# Patient Record
Sex: Female | Born: 1960 | Race: White | Hispanic: No | Marital: Married | Smoking: Former smoker
Health system: Southern US, Community
[De-identification: ages and names within clinical notes are randomized; demographics above are authoritative.]

## PROBLEM LIST (undated history)

## (undated) DIAGNOSIS — R000 Tachycardia, unspecified: Secondary | ICD-10-CM

## (undated) HISTORY — DX: Tachycardia, unspecified: R00.0

---

## 2003-08-14 ENCOUNTER — Ambulatory Visit: Admit: 2003-08-14 | Disposition: A | Discharge status: Home / Self Care | Payer: Self-pay | Source: Ambulatory Visit

## 2014-12-16 ENCOUNTER — Ambulatory Visit (INDEPENDENT_AMBULATORY_CARE_PROVIDER_SITE_OTHER): Payer: Self-pay | Admitting: Cardiovascular Disease

## 2015-11-17 ENCOUNTER — Ambulatory Visit (INDEPENDENT_AMBULATORY_CARE_PROVIDER_SITE_OTHER): Payer: Self-pay

## 2015-11-20 ENCOUNTER — Ambulatory Visit (INDEPENDENT_AMBULATORY_CARE_PROVIDER_SITE_OTHER): Payer: Self-pay | Admitting: Cardiovascular Disease

## 2015-11-20 ENCOUNTER — Ambulatory Visit (INDEPENDENT_AMBULATORY_CARE_PROVIDER_SITE_OTHER): Payer: Self-pay

## 2015-12-23 ENCOUNTER — Ambulatory Visit (INDEPENDENT_AMBULATORY_CARE_PROVIDER_SITE_OTHER): Payer: Self-pay

## 2015-12-23 ENCOUNTER — Other Ambulatory Visit (INDEPENDENT_AMBULATORY_CARE_PROVIDER_SITE_OTHER): Payer: Self-pay

## 2016-03-28 ENCOUNTER — Ambulatory Visit (INDEPENDENT_AMBULATORY_CARE_PROVIDER_SITE_OTHER): Payer: Self-pay | Admitting: Cardiovascular Disease

## 2016-03-28 LAB — VAHRT HISTORIC LVEF: Ejection Fraction: 60 %

## 2016-04-01 ENCOUNTER — Telehealth (INDEPENDENT_AMBULATORY_CARE_PROVIDER_SITE_OTHER): Payer: Self-pay | Admitting: Surgery

## 2016-04-01 NOTE — Telephone Encounter (Signed)
A user error has taken place: encounter opened in error, closed for administrative reasons.

## 2016-04-15 ENCOUNTER — Encounter (INDEPENDENT_AMBULATORY_CARE_PROVIDER_SITE_OTHER): Payer: Self-pay

## 2016-04-26 ENCOUNTER — Ambulatory Visit (INDEPENDENT_AMBULATORY_CARE_PROVIDER_SITE_OTHER): Payer: No Typology Code available for payment source | Admitting: Surgery

## 2016-04-26 ENCOUNTER — Encounter (INDEPENDENT_AMBULATORY_CARE_PROVIDER_SITE_OTHER): Payer: Self-pay | Admitting: Surgery

## 2016-04-26 VITALS — BP 123/82 | HR 70 | Temp 97.9°F | Resp 16 | Ht 62.5 in | Wt 160.2 lb

## 2016-04-26 DIAGNOSIS — Z803 Family history of malignant neoplasm of breast: Secondary | ICD-10-CM | POA: Insufficient documentation

## 2016-04-26 DIAGNOSIS — Z1589 Genetic susceptibility to other disease: Secondary | ICD-10-CM | POA: Insufficient documentation

## 2016-04-26 DIAGNOSIS — Z148 Genetic carrier of other disease: Secondary | ICD-10-CM

## 2016-04-26 DIAGNOSIS — R928 Other abnormal and inconclusive findings on diagnostic imaging of breast: Secondary | ICD-10-CM | POA: Insufficient documentation

## 2016-04-26 DIAGNOSIS — Z1501 Genetic susceptibility to malignant neoplasm of breast: Secondary | ICD-10-CM | POA: Insufficient documentation

## 2016-04-26 DIAGNOSIS — Z1509 Genetic susceptibility to other malignant neoplasm: Secondary | ICD-10-CM

## 2016-04-26 HISTORY — DX: Genetic susceptibility to other malignant neoplasm: Z15.09

## 2016-04-26 HISTORY — DX: Genetic susceptibility to malignant neoplasm of breast: Z15.01

## 2016-04-26 HISTORY — DX: Genetic susceptibility to other disease: Z15.89

## 2016-04-26 NOTE — Patient Instructions (Signed)
Please get your MRI done in August 2017. Please call our office for results 4 days after the test.  Also, please alternate every 6 month visits with Dr. Mayford Knife and Dr. Daphine Deutscher going forward.

## 2016-04-26 NOTE — Progress Notes (Signed)
Subjective:   Alison Schwartz is a 55 y.o. 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 She underwent genetic testing as a result of a family history of breast cancer. She did testing through Dr. Sherlon Handing.  She has had no new breast issues since her last visit with me in 2011. She is here to establish routine breast cancer surveillance care.  She also sees Dr. Mayford Knife.    Past Medical History   Diagnosis Date   . Tachycardia      Past Surgical History   Procedure Laterality Date   . Knee surgery       Family History   Problem Relation Age of Onset   . Heart attack Mother    . Lung cancer Father    . Prostate cancer Father    . Cancer Maternal Grandmother      Social History     Social History   . Marital Status: Married     Spouse Name: N/A   . Number of Children: N/A   . Years of Education: N/A     Occupational History   . Not on file.     Social History Main Topics   . Smoking status: Former Games developer   . Smokeless tobacco: Not on file   . Alcohol Use: 1.2 oz/week     2 Glasses of wine per week   . Drug Use: No   . Sexual Activity: Not on file     Other Topics Concern   . Not on file     Social History Narrative   . No narrative on file     Sulfa antibiotics  Current Outpatient Prescriptions   Medication Sig Dispense Refill   . aspirin EC 81 MG EC tablet Take 81 mg by mouth daily.     . Melatonin 10 MG Cap Take by mouth.     . metoprolol XL (TOPROL XL) 50 MG 24 hr tablet Take 50 mg by mouth daily.     . simvastatin (ZOCOR) 10 MG tablet Take 10 mg by mouth nightly.       No current facility-administered medications for this visit.       Review of Systems   Constitutional: Negative.    HENT: Negative.    Eyes: Negative.    Respiratory: Negative.    Cardiovascular: Negative.    Gastrointestinal: Negative.    Endocrine: Negative.    Genitourinary: Negative.    Musculoskeletal: Negative.    Skin: Negative.    Neurological: Negative.    Hematological: Negative.     Psychiatric/Behavioral: Negative.      Objective:     Filed Vitals:    04/26/16 1025   BP: 123/82   Pulse: 70   Temp: 97.9 F (36.6 C)   Resp: 16     Physical Exam   Constitutional: She is oriented to person, place, and time. She appears well-developed and well-nourished.   HENT:   Head: Normocephalic and atraumatic.   Eyes: Conjunctivae are normal. No scleral icterus.   Neck: Normal range of motion. Neck supple. No JVD present. No tracheal deviation present. No thyromegaly present.   Cardiovascular: Normal rate, regular rhythm and intact distal pulses.  Exam reveals no gallop and no friction rub.    No murmur heard.  Pulmonary/Chest: Effort normal and breath sounds normal. No respiratory distress. She has no wheezes. She has no rales. She exhibits no tenderness. Right breast exhibits no inverted  nipple, no mass, no nipple discharge, no skin change and no tenderness. Left breast exhibits no inverted nipple, no mass, no nipple discharge, no skin change and no tenderness. Breasts are symmetrical.   Abdominal: Soft. Bowel sounds are normal. She exhibits no distension and no mass. There is no tenderness. There is no rebound and no guarding.   Musculoskeletal: Normal range of motion.   Lymphadenopathy:     She has no cervical adenopathy.   Neurological: She is alert and oriented to person, place, and time.   Skin: Skin is warm and dry. No rash noted. No erythema. No pallor.   Psychiatric: She has a normal mood and affect. Her behavior is normal. Judgment and thought content normal.     Dr. Max Fickle note was reviewed.  The patient shared with me her genetic testing results on her phone - a copy is not available for review or scanning.      Assessment:     1. Monoallelic mutation of ATM gene  MRI breast bilateral with and without contrast   2. Family history of breast cancer  MRI breast bilateral with and without contrast   3. Abnormal MRI, breast  MRI breast bilateral with and without contrast       Previous RAD  Imaging:  No results found.  Patient Active Problem List    Diagnosis Date Noted   . Monoallelic mutation of ATM gene 16/08/9603   . Family history of breast cancer 04/26/2016   . Abnormal MRI, breast 04/26/2016     The patient is at risk for breast cancer - 2 fold over the general population.  Interestingly, she is at risk for radiation related gene mutations, and therefore I would suggest that she minimize mammography to Q 3 years, and rely instead on MRI. She had her most recent MRI done in February, and a 6 month follow up was recommended.  This will be done in August 2017.  IF normal, I would recommend yearly MRI, Q 6 month clinical examinations.      She understands this and concurs with the follow up recommendations.  Plan:     Orders Placed This Encounter   Procedures   . MRI breast bilateral with and without contrast     Standing Status: Future      Number of Occurrences:       Standing Expiration Date: 04/26/2017     Order Specific Question:  Is the patient pregnant?     Answer:  No     Order Specific Question:  What is the patient's sedation requirement?     Answer:  No Sedation     Order Specific Question:  Does the patient have a pacemaker or defibrillator?     Answer:  No     Order Specific Question:  Reason for Exam:     Answer:  history of ATM mutation, abnormal MRI on the left breast     Total of 45 minutes spent in consultation and exam of this patient.    Return in about 6 months (around 10/26/2016).

## 2016-07-25 ENCOUNTER — Telehealth (INDEPENDENT_AMBULATORY_CARE_PROVIDER_SITE_OTHER): Payer: Self-pay

## 2016-07-25 NOTE — Telephone Encounter (Signed)
Pt had a breast mri this morning 07/25/16 at [facility] and they told her something showed on the left breast she is concerned and would like to talk to you please call pt at [phone]

## 2016-07-26 ENCOUNTER — Other Ambulatory Visit (INDEPENDENT_AMBULATORY_CARE_PROVIDER_SITE_OTHER): Payer: Self-pay | Admitting: Surgery

## 2016-07-26 DIAGNOSIS — R928 Other abnormal and inconclusive findings on diagnostic imaging of breast: Secondary | ICD-10-CM

## 2016-07-26 NOTE — Telephone Encounter (Signed)
Discussed with patient the results of the MRI.    The lesion on the left side is still 5 mm and still enhances.  As it is persistent, it would be prudent to biopsy it.  She is in agreement with this.

## 2016-08-02 ENCOUNTER — Telehealth (INDEPENDENT_AMBULATORY_CARE_PROVIDER_SITE_OTHER): Payer: Self-pay

## 2016-08-02 NOTE — Telephone Encounter (Signed)
Pt had a breast bx done last Thursday at [facility] and she would like the results please call pt at [phone]

## 2016-08-02 NOTE — Telephone Encounter (Signed)
Called pt.  Notified about results

## 2016-08-09 ENCOUNTER — Encounter (INDEPENDENT_AMBULATORY_CARE_PROVIDER_SITE_OTHER): Payer: Self-pay

## 2017-02-21 ENCOUNTER — Institutional Professional Consult (permissible substitution) (INDEPENDENT_AMBULATORY_CARE_PROVIDER_SITE_OTHER): Payer: No Typology Code available for payment source | Admitting: Surgery

## 2017-03-06 ENCOUNTER — Ambulatory Visit (INDEPENDENT_AMBULATORY_CARE_PROVIDER_SITE_OTHER): Payer: Self-pay | Admitting: Nurse Practitioner

## 2017-03-08 ENCOUNTER — Telehealth (INDEPENDENT_AMBULATORY_CARE_PROVIDER_SITE_OTHER): Payer: Self-pay

## 2017-03-08 NOTE — Telephone Encounter (Signed)
Pt called and said she had her MRI done today. She wants to know if she needs to follow up.     Thanks,    Nelva Nay

## 2017-03-08 NOTE — Telephone Encounter (Signed)
Yes. She is overdue for a follow up

## 2017-03-14 ENCOUNTER — Encounter (INDEPENDENT_AMBULATORY_CARE_PROVIDER_SITE_OTHER): Payer: Self-pay | Admitting: Surgery

## 2017-03-14 ENCOUNTER — Ambulatory Visit (INDEPENDENT_AMBULATORY_CARE_PROVIDER_SITE_OTHER): Payer: No Typology Code available for payment source | Admitting: Surgery

## 2017-03-14 VITALS — BP 113/75 | HR 73 | Temp 98.1°F | Resp 12 | Ht 62.5 in | Wt 165.0 lb

## 2017-03-14 DIAGNOSIS — Z1501 Genetic susceptibility to malignant neoplasm of breast: Secondary | ICD-10-CM

## 2017-03-14 DIAGNOSIS — Z1509 Genetic susceptibility to other malignant neoplasm: Secondary | ICD-10-CM

## 2017-03-14 DIAGNOSIS — Z1589 Genetic susceptibility to other disease: Secondary | ICD-10-CM

## 2017-03-14 NOTE — Progress Notes (Signed)
Subjective:   Alison Schwartz is a 56 y.o. female who is here for close surveillance due to ATM gene carrier state.  She has no breast related issues since her last exam.  She is due to get her right rotator cuff repaired by Dr. Dallas Breeding soon.  She had an MRI done at [facility] last month.        Past Medical History:   Diagnosis Date   . Tachycardia      Past Surgical History:   Procedure Laterality Date   . KNEE SURGERY       Family History   Problem Relation Age of Onset   . Heart attack Mother    . Lung cancer Father    . Prostate cancer Father    . Cancer Maternal Grandmother      Social History     Social History   . Marital status: Married     Spouse name: N/A   . Number of children: N/A   . Years of education: N/A     Occupational History   . Not on file.     Social History Main Topics   . Smoking status: Former Games developer   . Smokeless tobacco: Never Used   . Alcohol use 1.2 oz/week     2 Glasses of wine per week   . Drug use: No   . Sexual activity: Not on file     Other Topics Concern   . Not on file     Social History Narrative   . No narrative on file     Sulfa antibiotics  Current Outpatient Prescriptions   Medication Sig Dispense Refill   . metoprolol XL (TOPROL XL) 50 MG 24 hr tablet Take 50 mg by mouth daily.     . simvastatin (ZOCOR) 10 MG tablet Take 10 mg by mouth nightly.     Marland Kitchen aspirin EC 81 MG EC tablet Take 81 mg by mouth daily.     . Melatonin 10 MG Cap Take by mouth.       No current facility-administered medications for this visit.        Review of Systems   Constitutional: Negative.    HENT: Negative.    Eyes: Negative.    Respiratory: Negative.    Cardiovascular: Negative.    Gastrointestinal: Negative.    Endocrine: Negative.    Genitourinary: Negative.    Musculoskeletal: Negative.    Skin: Negative.    Neurological: Negative.    Hematological: Negative.    Psychiatric/Behavioral: Negative.      Objective:     Vitals:    03/14/17 1451   BP: 113/75   Pulse: 73   Resp: 12   Temp: 98.1 F (36.7  C)     Physical Exam   Constitutional: She is oriented to person, place, and time. She appears well-developed and well-nourished.   HENT:   Head: Normocephalic and atraumatic.   Eyes: Conjunctivae are normal. No scleral icterus.   Neck: Normal range of motion. Neck supple. No JVD present. No tracheal deviation present. No thyromegaly present.   Cardiovascular: Normal rate, regular rhythm and intact distal pulses.  Exam reveals no gallop and no friction rub.    No murmur heard.  Pulmonary/Chest: Effort normal and breath sounds normal. No respiratory distress. She has no wheezes. She has no rales. She exhibits tenderness (She has some discomfort upon palpation of the right lateral chest wall. This could be due to her shoulder issues as well.).  Right breast exhibits no inverted nipple, no mass, no nipple discharge, no skin change and no tenderness. Left breast exhibits no inverted nipple, no mass, no nipple discharge, no skin change and no tenderness. Breasts are symmetrical.   Abdominal: Soft. Bowel sounds are normal. She exhibits no distension and no mass. There is no tenderness. There is no rebound and no guarding.   Musculoskeletal: Normal range of motion.   Lymphadenopathy:     She has no cervical adenopathy.   Neurological: She is alert and oriented to person, place, and time.   Skin: Skin is warm and dry. No rash noted. No erythema. No pallor.   Psychiatric: She has a normal mood and affect. Her behavior is normal. Judgment and thought content normal.     Dr. Max Fickle note was reviewed.  The patient shared with me her genetic testing results on her phone - a copy is not available for review or scanning.      Assessment:     1. Monoallelic mutation of ATM gene  MRI Breast Bilateral W WO Contrast    Mammo Digital Diagnostic Bilateral W Cad       Previous RAD Imaging:  No results found.  Patient Active Problem List    Diagnosis Date Noted   . Monoallelic mutation of ATM gene 78/29/5621   . Family history of  breast cancer 04/26/2016   . Abnormal MRI, breast 04/26/2016     The patient is at risk for breast cancer - 2 fold over the general population. She has no evidence of cancer currently.  MRI is due in 1 year as is a mammogram at that time ( Q3 years).  Plan:     Orders Placed This Encounter   Procedures   . MRI Breast Bilateral W WO Contrast     Standing Status:   Future     Standing Expiration Date:   03/14/2018     Order Specific Question:   Is the patient pregnant?     Answer:   No     Order Specific Question:   What is the patient's sedation requirement?     Answer:   No Sedation     Order Specific Question:   Does the patient have a pacemaker or defibrillator?     Answer:   No     Order Specific Question:   Reason for Exam:     Answer:   history of ATM mutation   . Mammo Digital Diagnostic Bilateral W Cad     Standing Status:   Future     Standing Expiration Date:   09/14/2018     Scheduling Instructions:      Please remind patient to bring MD order, referral or prescription with them on the day of the exam. If the order was faxed from the doctor's office to central scheduling please make sure the order is scanned into Epic.     Order Specific Question:   Select additional PRN/as needed imaging orders     Answer:   Tomosynthesis/3D mammogram     Order Specific Question:   Reason for Exam:     Answer:   history of ATM mutation, high risk screening     Order Specific Question:   Is the patient pregnant?     Answer:   No     RTO 1  Year with mammograms and MRI.  Alternate exams with Dr. Melvenia Beam.      Josephine Cables, MD. Carilyn Goodpasture

## 2017-06-22 ENCOUNTER — Ambulatory Visit (INDEPENDENT_AMBULATORY_CARE_PROVIDER_SITE_OTHER): Payer: Self-pay

## 2017-07-06 ENCOUNTER — Ambulatory Visit (INDEPENDENT_AMBULATORY_CARE_PROVIDER_SITE_OTHER): Payer: Self-pay

## 2017-07-10 ENCOUNTER — Ambulatory Visit (INDEPENDENT_AMBULATORY_CARE_PROVIDER_SITE_OTHER): Payer: Self-pay

## 2017-07-20 ENCOUNTER — Ambulatory Visit (INDEPENDENT_AMBULATORY_CARE_PROVIDER_SITE_OTHER): Payer: Self-pay

## 2017-07-24 ENCOUNTER — Ambulatory Visit (INDEPENDENT_AMBULATORY_CARE_PROVIDER_SITE_OTHER): Payer: Self-pay

## 2017-08-21 ENCOUNTER — Ambulatory Visit (INDEPENDENT_AMBULATORY_CARE_PROVIDER_SITE_OTHER): Payer: Self-pay

## 2017-09-05 ENCOUNTER — Ambulatory Visit (INDEPENDENT_AMBULATORY_CARE_PROVIDER_SITE_OTHER): Payer: Self-pay

## 2018-03-22 ENCOUNTER — Telehealth (INDEPENDENT_AMBULATORY_CARE_PROVIDER_SITE_OTHER): Payer: Self-pay

## 2018-03-22 NOTE — Telephone Encounter (Signed)
MRi is due now.  We spoke about alternating visits every 6 months with Dr. Mayford Knife, so we should stick to that schedule if at all possible.

## 2018-03-22 NOTE — Telephone Encounter (Signed)
Pt is coming to see you after Mammo in August but she had a question if she needed an MRI as well so she can do both mammo and MRI before comign to see you to discuss results?

## 2018-04-05 ENCOUNTER — Telehealth (INDEPENDENT_AMBULATORY_CARE_PROVIDER_SITE_OTHER): Payer: Self-pay

## 2018-04-05 DIAGNOSIS — Z803 Family history of malignant neoplasm of breast: Secondary | ICD-10-CM

## 2018-04-05 DIAGNOSIS — Z1501 Genetic susceptibility to malignant neoplasm of breast: Secondary | ICD-10-CM

## 2018-04-05 NOTE — Telephone Encounter (Signed)
Patient would like an order for her MRI

## 2018-05-02 ENCOUNTER — Other Ambulatory Visit (INDEPENDENT_AMBULATORY_CARE_PROVIDER_SITE_OTHER): Payer: Self-pay

## 2018-05-02 DIAGNOSIS — Z803 Family history of malignant neoplasm of breast: Secondary | ICD-10-CM

## 2018-05-02 DIAGNOSIS — Z1589 Genetic susceptibility to other disease: Secondary | ICD-10-CM

## 2018-05-02 DIAGNOSIS — Z1501 Genetic susceptibility to malignant neoplasm of breast: Secondary | ICD-10-CM

## 2018-05-04 ENCOUNTER — Telehealth (INDEPENDENT_AMBULATORY_CARE_PROVIDER_SITE_OTHER): Payer: Self-pay

## 2018-05-04 NOTE — Telephone Encounter (Addendum)
Pt called asking for results of MRI. Please give her a call at [phone].      Thanks,    Wm     Patient notified of non diagnostic MRI.  She has been called back to do additional contrast imaging - this has already been scheduled.    Josephine Cables, MD. Carilyn Goodpasture

## 2018-05-08 ENCOUNTER — Telehealth (INDEPENDENT_AMBULATORY_CARE_PROVIDER_SITE_OTHER): Payer: Self-pay

## 2018-05-08 NOTE — Telephone Encounter (Signed)
Pt had the repeat MRI done and it failed they couldn't get a good picture  and she wanted to go some place else and wanted to know if she needed a new order?

## 2018-05-16 ENCOUNTER — Other Ambulatory Visit (INDEPENDENT_AMBULATORY_CARE_PROVIDER_SITE_OTHER): Payer: Self-pay

## 2018-05-16 ENCOUNTER — Encounter (INDEPENDENT_AMBULATORY_CARE_PROVIDER_SITE_OTHER): Payer: Self-pay | Admitting: Surgery

## 2018-05-28 ENCOUNTER — Telehealth (INDEPENDENT_AMBULATORY_CARE_PROVIDER_SITE_OTHER): Payer: Self-pay

## 2018-05-28 NOTE — Telephone Encounter (Signed)
Pt is calling for results on an MRI that she repeated on 05/10/18 please call pt at [phone]

## 2018-06-07 ENCOUNTER — Encounter (INDEPENDENT_AMBULATORY_CARE_PROVIDER_SITE_OTHER): Payer: Self-pay | Admitting: Surgery

## 2018-06-07 ENCOUNTER — Ambulatory Visit (INDEPENDENT_AMBULATORY_CARE_PROVIDER_SITE_OTHER): Payer: No Typology Code available for payment source | Admitting: Surgery

## 2018-06-07 ENCOUNTER — Other Ambulatory Visit (INDEPENDENT_AMBULATORY_CARE_PROVIDER_SITE_OTHER): Payer: No Typology Code available for payment source | Admitting: Surgery

## 2018-06-07 VITALS — BP 110/75 | HR 75 | Temp 98.0°F | Resp 12 | Ht 62.5 in | Wt 156.0 lb

## 2018-06-07 DIAGNOSIS — Z1589 Genetic susceptibility to other disease: Secondary | ICD-10-CM

## 2018-06-07 DIAGNOSIS — Z1231 Encounter for screening mammogram for malignant neoplasm of breast: Secondary | ICD-10-CM

## 2018-06-07 DIAGNOSIS — Z1501 Genetic susceptibility to malignant neoplasm of breast: Secondary | ICD-10-CM

## 2018-06-07 DIAGNOSIS — Z1509 Genetic susceptibility to other malignant neoplasm: Secondary | ICD-10-CM

## 2018-06-07 DIAGNOSIS — Z803 Family history of malignant neoplasm of breast: Secondary | ICD-10-CM

## 2018-06-07 DIAGNOSIS — Z1239 Encounter for other screening for malignant neoplasm of breast: Secondary | ICD-10-CM

## 2018-06-07 NOTE — Patient Instructions (Signed)
Please followup with our office in 1 year  with repeat mammogram, MRI and examination.   Please return to the office sooner if any new problems or concerns should arise.

## 2018-06-07 NOTE — Progress Notes (Signed)
Breast Follow Up:    The patient presents for follow up of high risk screening for breast cancer.  She was last seen one year ago.  She has no new breast complaints and no new health issues. She is doing well.  She is an ATM carrier.  She had he rmammogram done yesterday and her MRI done in [facility] in June 2019.  Both studies show no evidence of cancer.    She has retired to [employer].    She has no new health issues and her shoulder is better.        Past Medical History :    Past Medical History:   Diagnosis Date   . Monoallelic mutation of ATM gene 1/61/0960   . Tachycardia        Past Surgical History:  Past Surgical History:   Procedure Laterality Date   . KNEE SURGERY     . ROTATOR CUFF REPAIR  2018       Family History:  Family History   Problem Relation Age of Onset   . Heart attack Mother    . Lung cancer Father    . Prostate cancer Father    . Cancer Maternal Grandmother        Social History:  Social History     Social History   . Marital status: Married     Spouse name: N/A   . Number of children: N/A   . Years of education: N/A     Occupational History   . Not on file.     Social History Main Topics   . Smoking status: Former Games developer   . Smokeless tobacco: Never Used   . Alcohol use 1.2 oz/week     2 Glasses of wine per week   . Drug use: No   . Sexual activity: Not on file     Other Topics Concern   . Not on file     Social History Narrative   . No narrative on file        ALLERGIES:  Allergies   Allergen Reactions   . Sulfa Antibiotics        Medications:    Current Outpatient Prescriptions:   .  aspirin EC 81 MG EC tablet, Take 81 mg by mouth daily., Disp: , Rfl:   .  Melatonin 10 MG Cap, Take by mouth., Disp: , Rfl:   .  metoprolol XL (TOPROL XL) 50 MG 24 hr tablet, Take 50 mg by mouth daily., Disp: , Rfl:   .  simvastatin (ZOCOR) 10 MG tablet, Take 10 mg by mouth nightly., Disp: , Rfl:     Review of Systems:  Constitutional: Negative.   HENT: Negative.   Respiratory: Negative.   Cardiovascular:  Negative.   Gastrointestinal: Negative for nausea, vomiting, diarrhea, constipation and abdominal distention.   Genitourinary: Negative.   Musculoskeletal: Negative.   Skin: no history of jaundice   Allergic/Immunologic: Negative for immunocompromised state.   Neurological: Negative.   Hematological: Negative for adenopathy. Does not bruise/bleed easily.   Psychiatric/Behavioral: Negative.     Objective:  Vitals:    06/07/18 1118   BP: 110/75   BP Site: Left arm   Patient Position: Sitting   Cuff Size: Medium   Pulse: 75   Resp: 12   Temp: 98 F (36.7 C)   TempSrc: Oral   Weight: 70.8 kg (156 lb)   Height: 1.588 m (5' 2.5")     Body mass index is 28.08  kg/m.     Physical Exam:   Constitutional: Alert, oriented.  Head: Normocephalic and atraumatic.   Eyes: No scleral icterus.   Neck: Normal range of motion. There is no cervical adenopathy  Cardiovascular: Normal rate, normal S1,2, no murmurs, rubs, or gallops.  Pulmonary/Chest: Effort normal, equal BS bilaterally without rales, wheezes, or stridor.  Breast: The breasts are symmetric, without skin changes, nipple changes or dimpling.     Musculoskeletal: Normal range of motion.   Neurological: alert and oriented to person, place, and time.   Skin:warm and dry  Psychiatric: Normal mood and affect. Behavior is normal. Judgment and thought content normal.   Nursing note and vitals reviewed.    Radiology:  Mammograms from [facility] were reviewed and the reports authenticated.  Reports from the MRI of the breast from [facility] were also reviewed. There is no sign of malignancy.        Assessment:Higher risk of breast cancer.  No sign of malignancy at this time.       Plan:Follow up in 1 year with repeat mammography and examination. Annual mammography and MRI.      Harvie Bridge MD, FACS

## 2018-06-21 ENCOUNTER — Ambulatory Visit (INDEPENDENT_AMBULATORY_CARE_PROVIDER_SITE_OTHER): Payer: No Typology Code available for payment source | Admitting: Surgery

## 2018-12-10 ENCOUNTER — Telehealth (INDEPENDENT_AMBULATORY_CARE_PROVIDER_SITE_OTHER): Payer: Self-pay

## 2018-12-10 NOTE — Telephone Encounter (Signed)
Pt's Cardiologist is recommending for her to have a CT of the heart and she would like your opinion on the amount of radiation for that test its to see if she has any blockages please call pt at [phone]

## 2018-12-10 NOTE — Telephone Encounter (Signed)
Fine for her to have the test.    Please let the patient know.

## 2018-12-11 ENCOUNTER — Telehealth (INDEPENDENT_AMBULATORY_CARE_PROVIDER_SITE_OTHER): Payer: Self-pay

## 2018-12-11 NOTE — Telephone Encounter (Signed)
Called pt and spoke to her about your response to her message she is agreeable.

## 2020-08-13 ENCOUNTER — Encounter (INDEPENDENT_AMBULATORY_CARE_PROVIDER_SITE_OTHER): Payer: Self-pay | Admitting: Surgery

## 2021-02-15 NOTE — Progress Notes (Signed)
OFFICE  [address] [place], [place] [address]     Alison, Schwartz    Date of Visit:  03/06/2017  Date of Birth: Aug 15, 1961  Age: 60 yrs.   Medical Record Number: [id number]  __  CURRENT DIAGNOSES     1. Hyperlipidemia, mixed, E78.2   2. Palpitations, R00.2  3. Pre-op cardiovascular examination, Z01.810  4. Heart murmur unspecified, R01.1  5. Chest pain, unspecified, R07.9  6. Supraventricular tachycardia, I47.1  __   ALLERGIES    Sulfa (Sulfonamide Antibiotics), Intolerance-unknown  __   MEDICATIONS     1. simvastatin 10 mg tablet, 1 po qd  2. aspirin 81 mg tablet,delayed release, 1 po qd  3. Zyrtec 10 mg tablet, PRN  4. biotin 1 mg tablet, 1 po qd  5. metoprolol succinate  ER 50 mg tablet,extended release 24 hr, 1 po q am  6. Align 4 mg capsule, 1 po qd  7. diclofenac sodium 50 mg tablet,delayed release, 1po bid  8. tramadol 50 mg tablet, PRN  __  CHIEF  COMPLAINT/REASON FOR VISIT  Followup of Hyperlipidemia, mixed, Followup of Palpitations and Pre-Operative Cardiovascular Evaluation  __  HISTORY  OF PRESENT ILLNESS  Alison Schwartz is a 60 year old of Dr. Shawnee Knapp with a history of palpitations who is on metoprolol with control of her palpitations. Holter monitor has only ever showed periods of mild sinus  tachycardia, but no dysrhythmias. She had a normal echocardiogram and she had a normal stress echo in 2015. She walks three to five miles a day without chest pain, pressure, tightness, burning, squeezing. No shortness of breath. No syncope, presyncope.  She is here today for a preoperative assessment. She is going to have rotator cuff surgery.  __  PAST HISTORY      Past Medical Illnesses: Hyperlipidemia, Anemia, GERD, Osteoporosis;  Past Cardiac Illnesses : No previous history of cardiac disease.; Infectious Diseases: No previous history of significant infectious diseases.;  Surgical Procedures: No previous surgical procedures.; Trauma History: No previous history of significant  trauma.;  Cardiology Procedures-Invasive: No previous interventional or invasive cardiology procedures.;  Cardiology Procedures-Noninvasive: MPI Dual Isotope Exercise -Normal November 2006, Echocardiogram September 2006, Stress Echocardiogram -Normal August 2015, Echocardiogram February 2017;  Left Ventricular Ejection Fraction: LVEF of 60% documented via echocardiogram on 12/23/2015  ___   FAMILY HISTORY  Father -- Carcinoma (lung), Carcinoma (prostate), Malignant neoplasm of liver   Mother -- Hypertension, Chronic obstructive pulmonary disease, Coronary arteriosclerosis    __  CARDIAC RISK FACTORS     Tobacco Abuse : has never used tobacco; Family History of Heart Disease: positive; Hyperlipidemia : positive; Hypertension: negative;  Diabetes Mellitus : negative; Prior History of Heart Disease: negative; Obesity : negative; Sedentary Life Style:negative; UEA:[id number]   __  SOCIAL HISTORY    Alcohol Use : drinks occasionally; Smoking: Does not smoke; Never smoker ([id number]); Diet : Regular diet and Caffeine use-1-2 per day; Exercise: No regular exercise;   __  PHYSICAL EXAMINATION     Vital Signs:  Blood Pressure:  110/70 Sitting, Left arm, regular cuff  114/70 Sitting, Right arm, regular cuff     Weight: 164.00 lbs.  Height: 62.5"  BMI:  29   Pulse: 69/min.       Constitutional:  Cooperative, alert and oriented,well developed, well nourished, in no acute distress. Skin: Warm and dry to touch, no apparent skin lesions, or masses  noted. ENT: Ears, Nose and throat reveal no gross abnormalities. No pallor or cyanosis. Dentition good.  Neck: no JVD, no bruits Chest: clear to auscultation bilaterally  Cardiac: Regular rhythm, S1 normal, S2 normal, No S3 or S4, Apical impulse not displaced, no murmurs, gallops or rubs detected. Abdomen : Abdomen soft, bowel sounds normoactive, no masses, no hepatosplenomegaly, non-tender, no bruits Peripheral Pulses: pulses full and equal in all extremities  Extremities/Back: no edema present  Neurological: No gross motor or sensory deficits noted, affect appropriate,  oriented to time, person and place.   __    Medications added today by the physician:      ECG:   ECG is a normal sinus rhythm.     IMPRESSIONS:   1. Preoperative assessment. I discussed this case with Dr. Shawnee Knapp. The patient is   able to perform more than four METs of activity and may proceed with her   surgery without further cardiac testing.  2. Episodic palpitations  that are likely short episodes of sinus tachycardia.   There has been no evidence of atrioventricular nodal reentrant tachycardia   or topical atrial tachycardia.  3. Normal echocardiogram.  4. Normal stress echo in 2015.    PLAN:    The patient will follow up in one year with Dr. Shawnee Knapp. Please do not hesitate to contact [facility] if we can be of any assistance throughout the perioperative period.    Arville Care, ANP     Tid: [id number]    cc: Gwenyth Ober MD  Dagmar Hait MD    Enclosure: ECG    AP

## 2021-02-15 NOTE — Progress Notes (Signed)
[facility] OFFICE  [address] [facility] [address] [place] [address]     [place], [place]    Date of Visit:  12/16/2014  Date of Birth: [birth year]  Age: 60 yrs.   Medical Record Number: [id number]  __  CURRENT DIAGNOSES     1. Hyperlipidemia, mixed, E78.2   2. Supraventricular tachycardia, I47.1  3. Chest pain, unspecified, R07.9  __  ALLERGIES     Sulfa (Sulfonamide Antibiotics), Intolerance-unknown  __  MEDICATIONS     1. simvastatin 10  mg tablet, 1 po qd  2. oxaprozin 600 mg tablet, 1 po qd PRN  3. melatonin 10 mg tablet, 1 po qhs  4. metoprolol succinate ER 25 mg tablet,extended release 24 hr, 1/2 tab qd  5. aspirin 81 mg tablet,delayed release, 1 po qd  __   CHIEF COMPLAINT/REASON FOR VISIT  Followup of Hyperlipidemia, mixed and Followup of Supraventricular tachycardia  __  HISTORY OF PRESENT ILLNESS   A 60 year old woman with episodic tachydysrhythmias, a normal echo and normal exercise study, mild hyperlipidemia, and palpitations who now returns for followup. The patient's event monitor showed some very short episodes of supraventricular tachycardia  but no sustained episodes. Her stress echo study was entirely normal demonstrating normal left ventricular systolic function with no evidence of exercise-induced ischemia with a normal heart rate and blood pressure response and no significant dysrhythmias.  She returns today feeling reasonably well. She has had very few symptoms of palpitations and no sustained tachydysrhythmias. She continues on the low-dose beta blocker and has taken an additional dose one or two times over the past six months. Her general  health has been good.    __  PAST HISTORY     Past Medical Illnesses : Hyperlipidemia, Anemia, GERD, Osteoporosis;  Past Cardiac Illnesses: No previous history of cardiac  disease.; Infectious Diseases: No previous history of significant infectious diseases.; Surgical Procedures : No previous surgical procedures.; Trauma History: No  previous history of significant trauma.; Cardiology  Procedures-Invasive: No previous interventional or invasive cardiology procedures.; Cardiology Procedures-Noninvasive : MPI Dual Isotope Exercise -Normal November 2006, Echocardiogram September 2006, Stress Echocardiogram -Normal August 2015; Left Ventricular Ejection Fraction : LVEF of 69% documented via nuclear study on 09/30/2005  REVIEW OF SYSTEMS     General: Denies recent weight loss, weight gain, fever or chills or change in exercise tolerance.; Integumentary : Denies any change in hair or nails, rashes, or skin lesions.; Eyes: Denies diplopia, history of glaucoma or visual field defects.;  Ears, Nose, Throat, Mouth: Denies any hearing loss, epistaxis, hoarseness or difficulty speaking.;Respiratory : Denies dyspnea, cough, wheezing or hemoptysis.; Cardiovascular: Please review HPI; Abdominal  : Denies ulcer disease, hematochezia or melena.;Musculoskeletal:Denies any history of venous insufficiency, arthritic symptoms or back problems.;  Neurological : Denies any history of recurrent strokes, TIA, or seizure disorder.; Psychiatric: Denies  any history of depression, substance abuse or change in cognitive functions.; Endocrine: Denies any history of weight change, heat/cold intolerance,  polydipsia, or polyuria; Hematologic/Immunologic: Denies any food allergies, seasonal allergies, bleeding disorders.  __   PHYSICAL EXAMINATION    Vital Signs:  Blood Pressure:  122/80 Sitting, Left arm, regular  cuff  126/82 Sitting, Right arm, regular cuff    Weight: 154.00 lbs.  Height:  62.5"  BMI: 27   Pulse: 63/min.    Respirations: 18/min.       Constitutional: Cooperative, alert and oriented,well developed,  well nourished, in no acute distress. Skin: Warm and dry to touch, no  apparent skin lesions, or masses noted.  Head: Normocephalic, normal hair pattern, no masses or tenderness ENT: Ears, Nose and throat reveal  no gross abnormalities. No pallor or cyanosis.  Dentition good. Neck: No palpable masses or adenopathy, no thyromegaly, no JVD, carotid pulses are full  and equal bilaterally without bruits. Chest: Normal symmetry, no tenderness to palpation, normal respiratory excursion, no intercostal retraction, no  use of accessory muscles, normal diaphragmatic excursion, clear to auscultation and percussion. Cardiac: Regular rhythm, S1 normal, S2 normal, No S3  or S4, Apical impulse not displaced, no murmurs, gallops or rubs detected. Abdomen: Abdomen soft, bowel sounds normoactive, no masses, no hepatosplenomegaly,  non-tender, no bruits Peripheral Pulses: The femoral, popliteal, dorsalis pedis, and posterior tibial pulses are full and equal bilaterally with no bruits  auscultated. Extremities/Back: No deformities, clubbing, cyanosis, erythema or edema observed. There are no spinal abnormalities noted. Normal muscle  strength and tone. Neurological: No gross motor or sensory deficits noted, affect appropriate, oriented to time, person and place.   __     Medications added today by the physician:      ECG: Twelve-lead ECG demonstrates a sinus mechanism, no change.    IMPRESSIONS:  1. Episodes of tachydysrhythmias that likely represent  very short episodes of ectopic   atrial tachycardia.  2. Normal echocardiogram demonstrating normal left ventricular systolic function and   no valvular pathology.  3. Normal stress echocardiogram study with a history of atypical chest  discomfort.  4. Mild cholesterol elevation.     RECOMMENDATIONS: No change in medication is recommended. The patient will call if her symptoms worsen. Otherwise, I will plan to see her back in one year or p.r.n.    Jonetta Speak, M.D.,  F.A.C.C.    WSL/tutjm     cc: Dagmar Hait MD    rw      ____________________________   Christianne Dolin  Return Visit 15 MIN 1 year  12 Lead ECG Today        Buckner Malta, MD, [facility]

## 2021-02-15 NOTE — Progress Notes (Signed)
[facility] OFFICE  [address] [facility] [address] [place] [address]     [place], [place]    Date of Visit:  11/20/2015  Date of Birth: [birth year]  Age: 60 yrs.   Medical Record Number: [id number]  __  CURRENT DIAGNOSES     1. Hyperlipidemia, mixed, E78.2   2. Supraventricular tachycardia, I47.1  3. Palpitations, R00.2  4. Heart murmur unspecified, R01.1  5. Chest pain, unspecified, R07.9  __  ALLERGIES     Sulfa (Sulfonamide Antibiotics), Intolerance-unknown  __  MEDICATIONS     1. simvastatin 10  mg tablet, 1 po qd  2. melatonin 10 mg tablet, 1 po qhs  3. aspirin 81 mg tablet,delayed release, 1 po qd  4. metoprolol succinate ER 25 mg tablet,extended release 24 hr, 1 tab qd  5. Augmentin 500 mg-125 mg tablet, 1 po bid 7 day course   6. metoprolol tartrate 25 mg tablet, 1 po bid  __  CHIEF COMPLAINT/REASON FOR VISIT  Followup of Hyperlipidemia, mixed, Followup of Palpitations  and Followup of Supraventricular tachycardia  __  HISTORY OF PRESENT ILLNESS  [name] is 60 years old. She has a history of episodic tachy-dysrhythmias  in the past believed to be in ectopic tachycardia, a normal echocardiogram and normal exercise study and now returns for early followup due to recurrence of symptoms. Starting about four to six weeks ago she has noted some episodes of a fast, regular  heartbeat that would last anywhere from a couple of minutes to five minutes. It was normally not associated with any other symptoms but on New Year's Eve it was associated with lightheadedness for a short period of time. Her heart monitor has recorded  heartrates in the 160-170 range. She denies any chest, arm, neck, throat, or jaw discomfort. She denies orthopnea or paroxysmal nocturnal dyspnea. She did just wear a 48-hour Holter monitor and brought it back. The prescan demonstrated a peak heartrate  of 144 that appears to be a sinus tachycardia and occurred while she was exercising. Her last echocardiogram was in 2009.    _  PAST HISTORY      Past Medical Illnesses: Hyperlipidemia, Anemia, GERD, Osteoporosis;  Past Cardiac Illnesses : No previous history of cardiac disease.; Infectious Diseases: No previous history of significant infectious diseases.;  Surgical Procedures: No previous surgical procedures.; Trauma History: No previous history of significant  trauma.; Cardiology Procedures-Invasive: No previous interventional or invasive cardiology procedures.;  Cardiology Procedures-Noninvasive: MPI Dual Isotope Exercise -Normal November 2006, Echocardiogram September 2006, Stress Echocardiogram -Normal August 2015;  Left Ventricular Ejection Fraction: LVEF of 55% documented via echocardiogram, stress on 07/11/2014   SOCIAL HISTORY    Alcohol Use: drinks occasionally;  Smoking: Does not smoke; Never smoker ([id number]); Diet: Regular diet and Caffeine use-1-2 per day;  Exercise: No regular exercise;   __  PHYSICAL EXAMINATION    Vital Signs:   Blood Pressure:  150/90 Sitting, Left arm, regular cuff  140/80 Sitting, Right arm, regular cuff    Weight:  157.80 lbs.  Height: 62.5"  BMI: 28    Pulse: 77/min.       Constitutional: Cooperative, alert and oriented,well developed, well  nourished, in no acute distress. Skin: Warm and dry to touch, no apparent skin lesions, or masses noted.  ENT: Ears, Nose and throat reveal no gross abnormalities. No pallor or cyanosis. Dentition good. Neck : No palpable masses or adenopathy, no thyromegaly, no JVD, carotid pulses are full and equal bilaterally without  bruits. Chest: Normal symmetry, no  tenderness to palpation, normal respiratory excursion, no intercostal retraction, no use of accessory muscles, normal diaphragmatic excursion, clear to auscultation and percussion. Cardiac : Regular rhythm, S1 normal, S2 normal, No S3 or S4, Apical impulse not displaced, no murmurs, gallops or rubs detected. Abdomen: Abdomen soft, bowel  sounds normoactive, no masses, no hepatosplenomegaly, non-tender, no  bruits Peripheral Pulses: The femoral, popliteal, dorsalis pedis, and posterior  tibial pulses are full and equal bilaterally with no bruits auscultated. Extremities/Back: No deformities, clubbing, cyanosis, erythema or edema observed.  There are no spinal abnormalities noted. Normal muscle strength and tone. Neurological: No gross motor or sensory deficits noted, affect appropriate,  oriented to time, person and place.   __    Medications added today by the physician:  metoprolol tartrate 25 mg tablet, 1 po bid, 60     ELECTROCARDIOGRAM:  The 12-lead electrocardiogram demonstrates a sinus mechanism and is within normal limits at a heartrate of 77.    IMPRESSIONS:  1. Episodes of tachy-dysrhythmias that likely represent short episodes of either atrioventricular    nodal reentry tachycardia or an ectopic atrial tachycardia. We have not been successful in   capturing any of these.  2. Normal echocardiogram in the past.  3. Normal stress echocardiogram study in the past.   4. Mildly elevated cholesterol.      RECOMMENDATIONS:  The patient will try to increase her metoprolol to 25 mg b.i.d. to see if this can abolish the symptoms. If they continue, I will have her wear a patch monitor for one month and return for a followup echocardiogram. We have an appointment  scheduled after that. If we are still unable to capture any of these episodes and they continue in spite of beta blocker medication, I would then have her get together with one of our electrophysiologists.     Jonetta Speak, MD, [facility]     WSL/tuarg    cc: Dagmar Hait MD    rw  ____________________________  TODAYS ORDERS  2D, color flow, doppler At [facility]  Mobile Cardiac Telemetry 1 month   12 Lead ECG Today  Diet mgmt edu, guidance and counseling TODAY

## 2021-02-15 NOTE — Progress Notes (Signed)
[facility] [facility] OFFICE  [address] [place], [place] [address]     [place], [place]    Date of Visit:  03/28/2016  Date of Birth: May 05, 1961  Age: 60 yrs.   Medical Record Number: [id number]  __  CURRENT DIAGNOSES     1. Hyperlipidemia, mixed, E78.2   2. Supraventricular tachycardia, I47.1  3. Palpitations, R00.2  4. Heart murmur unspecified, R01.1  5. Chest pain, unspecified, R07.9  __  ALLERGIES     Sulfa (Sulfonamide Antibiotics), Intolerance-unknown  __  MEDICATIONS     1. simvastatin 10  mg tablet, 1 po qd  2. melatonin 10 mg tablet, 1 po qhs  3. aspirin 81 mg tablet,delayed release, 1 po qd  4. metoprolol tartrate 25 mg tablet, 1 po bid  5. Zyrtec 10 mg tablet, PRN  6. biotin 1 mg tablet, 1 po qd  7. metoprolol  succinate ER 50 mg tablet,extended release 24 hr, 1 po q am  __  CHIEF COMPLAINT/REASON FOR VISIT  Followup of Hyperlipidemia, mixed, Followup  of Palpitations and Followup of Supraventricular tachycardia  __  HISTORY OF PRESENT ILLNESS  This is a 60 year old woman undergoing evaluation  for palpitations and episodic tachydysrhythmias, returns for routine followup. Holter monitor only demonstrated periods of mild sinus tachycardia but no dysrhythmias were seen. Peak heart rate was 144. Her echocardiogram was essentially normal with trace  to mild tricuspid and mitral regurgitation. The left atrium was towards the upper limit of normal size and there was normal left ventricular systolic function. The patient had her metoprolol increased to 25 mg b.i.d. and returns today feeling better.  She has had no sustained dysrhythmias and has had just some very brief episodes of mild tachycardia but this was clearly associated with anxiety and stress. She feels well. Her blood pressure has been normal, and she is tolerating the medication. She  would like to try a once a day formulation.   __  PAST HISTORY     Past Medical Illnesses : Hyperlipidemia, Anemia, GERD, Osteoporosis;  Past Cardiac Illnesses: No  previous history of cardiac  disease.; Infectious Diseases: No previous history of significant infectious diseases.; Surgical Procedures : No previous surgical procedures.; Trauma History: No previous history of significant trauma.; Cardiology  Procedures-Invasive: No previous interventional or invasive cardiology procedures.; Cardiology Procedures-Noninvasive : MPI Dual Isotope Exercise -Normal November 2006, Echocardiogram September 2006, Stress Echocardiogram -Normal August 2015, Echocardiogram February 2017; Left Ventricular Ejection Fraction : LVEF of 60% documented via echocardiogram on 12/23/2015  SOCIAL HISTORY    Alcohol Use : drinks occasionally; Smoking: Does not smoke; Never smoker ([id number]); Diet : Regular diet and Caffeine use-1-2 per day; Exercise: No regular exercise;   __  PHYSICAL EXAMINATION     Vital Signs:  Blood Pressure:  148/80 Sitting, Left arm, regular cuff  140/80 Sitting, Right arm,  regular cuff    Weight: 155.60 lbs.  Height: 62.5"   BMI: 28   Pulse: 67/min.        Constitutional: Cooperative, alert and oriented,well developed, well nourished, in no acute distress. Skin:  Warm and dry to touch, no apparent skin lesions, or masses noted. ENT: Ears, Nose and throat reveal  no gross abnormalities. No pallor or cyanosis. Dentition good. Neck: No palpable masses or adenopathy,  no thyromegaly, no JVD, carotid pulses are full and equal bilaterally without bruits. Chest: Normal symmetry, no tenderness to palpation, normal respiratory  excursion, no intercostal retraction, no use of accessory muscles, normal diaphragmatic excursion, clear  to auscultation and percussion. Cardiac: Regular  rhythm, S1 normal, S2 normal, No S3 or S4, Apical impulse not displaced, no murmurs, gallops or rubs detected. Abdomen: Abdomen soft, bowel sounds normoactive,  no masses, no hepatosplenomegaly, non-tender, no bruits Peripheral Pulses: The femoral, popliteal, dorsalis  pedis, and posterior tibial pulses are  full and equal bilaterally with no bruits auscultated. Extremities/Back: No deformities, clubbing, cyanosis, erythema  or edema observed. There are no spinal abnormalities noted. Normal muscle strength and tone. Neurological:  No gross motor or sensory deficits noted, affect appropriate, oriented to time, person and place.   __    Medications added today by the physician:   metoprolol succinate ER 50 mg tablet,extended release 24 hr, 1 po q am, 90        IMPRESSIONS:  1. Episodic tachydysrhythmias that likely  represent short episodes of sinus tachycardia. We have   not found any evidence of an atrioventricular node reentrant tachycardia or ectopic atrial tachycardia on   multiple forms of testing.   2. Normal echocardiogram with only minimal mitral  and tricuspid regurgitation.   3. Normal stress echocardiographic study in the past.   4. Mildly elevated cholesterol.     RECOMMENDATIONS:   The patient is going to try metoprolol succinate 50 mg daily to see if this also works as well and makes dosing easier. It will be up to her whether she wants to take the b.i.d. tartrate or the once a day succinate. She will call if she is having any  recurrent symptoms. Otherwise, I will see her back in one year or p.r.n.     Jonetta Speak, MD, [facility]    WSL/tumam    cc: Dagmar Hait  MD    RW  ___________________________  Christianne Dolin  Return Visit 15 MIN 1 year  12 Lead ECG Today  Diet mgmt edu, guidance and  counseling TODAY

## 2021-11-10 ENCOUNTER — Encounter (INDEPENDENT_AMBULATORY_CARE_PROVIDER_SITE_OTHER): Payer: Self-pay | Admitting: Surgery

## 2023-05-24 ENCOUNTER — Ambulatory Visit (INDEPENDENT_AMBULATORY_CARE_PROVIDER_SITE_OTHER): Payer: No Typology Code available for payment source | Admitting: Physician Assistant

## 2023-05-24 ENCOUNTER — Encounter (INDEPENDENT_AMBULATORY_CARE_PROVIDER_SITE_OTHER): Payer: Self-pay

## 2023-05-24 VITALS — BP 117/81 | HR 91 | Temp 96.4°F | Resp 18 | Ht 63.0 in | Wt 150.0 lb

## 2023-05-24 DIAGNOSIS — J329 Chronic sinusitis, unspecified: Secondary | ICD-10-CM

## 2023-05-24 DIAGNOSIS — B9689 Other specified bacterial agents as the cause of diseases classified elsewhere: Secondary | ICD-10-CM

## 2023-05-24 DIAGNOSIS — R519 Headache, unspecified: Secondary | ICD-10-CM

## 2023-05-24 MED ORDER — AMOXICILLIN-POT CLAVULANATE 875-125 MG PO TABS
1.0000 | ORAL_TABLET | Freq: Two times a day (BID) | ORAL | 0 refills | Status: AC
Start: 2023-05-24 — End: 2023-06-03

## 2023-05-24 NOTE — Patient Instructions (Signed)
You are being treated for: sinusitis  You have been prescribed: oral antibiotic   Helpful over the counter medicine you can use:  Tylenol and ibuprofen for headache and fever  Hydration and bland diet  Viral infection, treat symptomatically with:  Deslyn 12 hour for cough  Sudafed as a decongestant, If high blood pressure coricidin HBP  Flonase nasal spray for nasal congestion  Neti pot for sinus rinse  Mucinex 12 hour for mucous thinning    For follow up you should see improvement in 4-5 days, if not return to [facility]

## 2023-05-24 NOTE — Progress Notes (Signed)
[facility]  URGENT  CARE  PROGRESS NOTE     Patient: Alison Schwartz   Date: 05/24/2023   MRN: [id number]       Alison Schwartz is a 62 y.o. female      HISTORY     History obtained from: Patient    Chief Complaint   Patient presents with    Sinus Problem     Pt states to have sinus problems started a few weeks back and has come back, headaches, migraines, brain on fire per pt,           Sinus Problem  Associated symptoms include headaches and sinus pressure.      Pt is a 62 yo female who is visiting from [place] who has a history of sinusitis and sinus related migraine HA who aprox one month ago had nasal congestion with sinus pain/pressure and HA.  Her PCP sent a RX for azithromycin.  She reports her symptoms were better after taking med but then aprox a week ago she has started having migraine HA with sinus pain/pressure and drainage again. Pt denies fever or cough.    Review of Systems   HENT:  Positive for sinus pressure and sinus pain.    Neurological:  Positive for headaches.   All other systems reviewed and are negative.      History:    Pertinent Past Medical, Surgical, Family and Social History were reviewed.        Current Outpatient Medications:     metoprolol XL (TOPROL XL) 50 MG 24 hr tablet, Take 1 tablet (50 mg) by mouth daily, Disp: , Rfl:     Mounjaro 12.5 MG/0.5ML injection, , Disp: , Rfl:     pantoprazole (PROTONIX) 20 MG tablet, TAKE 1 TABLET BY MOUTH TWICE DAILY 30 MINUTES BEFORE BREAKFAST AND DINNER, Disp: , Rfl:     rosuvastatin (CRESTOR) 40 MG tablet, Take 1 tablet (40 mg) by mouth nightly, Disp: , Rfl:     amoxicillin-clavulanate (AUGMENTIN) 875-125 MG per tablet, Take 1 tablet by mouth 2 (two) times daily for 10 days, Disp: 20 tablet, Rfl: 0    aspirin EC 81 MG EC tablet, Take 81 mg by mouth daily., Disp: , Rfl:     Melatonin 10 MG Cap, Take by mouth., Disp: , Rfl:     simvastatin (ZOCOR) 10 MG tablet, Take 10 mg by mouth nightly., Disp: , Rfl:     Allergies   Allergen Reactions    Sulfa  Antibiotics        Medications and Allergies reviewed.    PHYSICAL EXAM     Vitals:    05/24/23 1010   BP: 117/81   BP Site: Left arm   Patient Position: Sitting   Cuff Size: Large   Pulse: 91   Resp: 18   Temp: (!) 96.4 F (35.8 C)   TempSrc: Tympanic   SpO2: 98%   Weight: 68 kg (150 lb)   Height: 1.6 m (5\' 3" )       Physical Exam  Constitutional:       General: She is not in acute distress.     Appearance: She is well-developed.   HENT:      Head: Normocephalic and atraumatic.      Right Ear: Hearing, tympanic membrane, ear canal and external ear normal.      Left Ear: Hearing, tympanic membrane, ear canal and external ear normal.      Nose: Congestion present.  Right Sinus: Maxillary sinus tenderness and frontal sinus tenderness present.      Left Sinus: Maxillary sinus tenderness and frontal sinus tenderness present.      Mouth/Throat:      Pharynx: Oropharynx is clear. No oropharyngeal exudate.     Eyes: Conjunctivae are normal. Right conjunctiva is not injected. Left conjunctiva is not injected. No scleral icterus. Cardiovascular:      Rate and Rhythm: Normal rate and regular rhythm.      Heart sounds: Normal heart sounds. No murmur heard.  Pulmonary:      Effort: Pulmonary effort is normal. No respiratory distress.      Breath sounds: Normal breath sounds. No wheezing.   Musculoskeletal:      Cervical back: Normal range of motion and neck supple.   Lymphadenopathy:      Cervical: No cervical adenopathy.   Neurological:      Mental Status: She is alert and oriented to person, place, and time.   Skin:     General: Skin is warm and dry.      Findings: No rash.   Psychiatric:         Behavior: Behavior is cooperative.   Vitals and nursing note reviewed.         UCC COURSE     There were no labs reviewed with this patient during the visit.    There were no x-rays reviewed with this patient during the visit.    No current facility-administered medications for this visit.       PROCEDURES      Procedures    MEDICAL DECISION MAKING     History, physical, labs/studies most consistent with bacterial sinusitis as the diagnosis.        Chart Review:  Prior PCP, Specialist and/or ED notes reviewed today: No  Prior labs/images/studies reviewed today: No    Differential Diagnosis: Upper Respiratory Infection, Sinusitis, Pneumonia, COVID-19, Allergic Rhinitis       ASSESSMENT     Encounter Diagnoses   Name Primary?    Bacterial sinusitis Yes    Nonintractable episodic headache, unspecified headache type                 PLAN      PLAN: You are being treated for: sinusitis  You have been prescribed: oral antibiotic   Helpful over the counter medicine you can use:  Tylenol and ibuprofen for headache and fever  Hydration and bland diet  Viral infection, treat symptomatically with:  Deslyn 12 hour for cough  Sudafed as a decongestant, If high blood pressure coricidin HBP  Flonase nasal spray for nasal congestion  Neti pot for sinus rinse  Mucinex 12 hour for mucous thinning    For follow up you should see improvement in 4-5 days, if not return to UC             No orders of the defined types were placed in this encounter.    Requested Prescriptions     Signed Prescriptions Disp Refills    amoxicillin-clavulanate (AUGMENTIN) 875-125 MG per tablet 20 tablet 0     Sig: Take 1 tablet by mouth 2 (two) times daily for 10 days       Discussed results and diagnosis with patient/family.  Reviewed warning signs for worsening condition, as well as, indications for follow-up with primary care physician and return to urgent care clinic.   Patient/family expressed understanding of instructions.     An After Visit Summary was provided  to the patient.

## 2024-01-04 IMAGING — MR MRI CERVICAL SPINE WITHOUT CONTRAST
7 of 12 series · 11 of 48 positions shown · IV contrast (gadolinium)
Comparison: X-ray from December 05, 2023.

________________________________________________________________________________________________ 
MRI CERVICAL SPINE WITHOUT CONTRAST, 01/04/2024 [DATE]: 
CLINICAL INDICATION: Neck pain. Bilateral upper extremity burning inside biceps.
TECHNIQUE: Multiplanar, multiecho position MR images of the cervical spine were 
performed without intravenous gadolinium enhancement. Patient was scanned on a 
magnet.

[Series 201: survey · axial · 10.0mm · 1.24mm/px · z∈[-17,+202]mm · 2 of 10 slices shown]
[im 1/10]
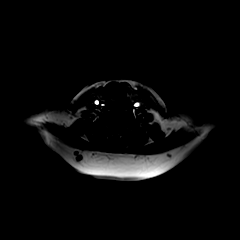
[im 10/10]
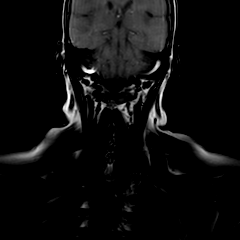

[Series 301: t2w_cor-surv · coronal · 5.0mm · 0.69mm/px · 1 of 7 slices shown]
[im 1/7]
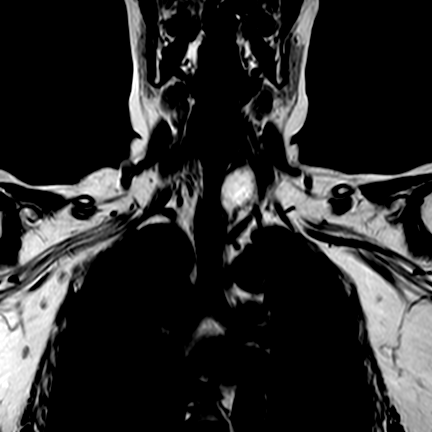

[Series 401: T1 · sagittal · 3.0mm · 0.39mm/px · 1 of 15 slices shown]
[im 1/15]
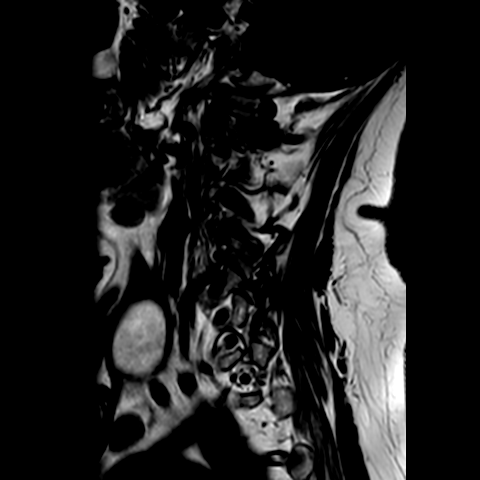

[Series 502: (id)_mdixon_tse · sagittal · 3.0mm · 0.34mm/px · 1 of 15 slices shown]
[im 1/15]
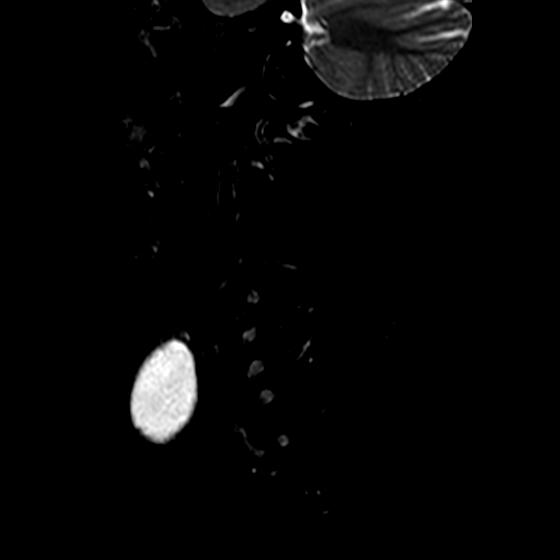

[Series 503: st2w_mdixon_tse · sagittal · 3.0mm · 0.34mm/px · 1 of 15 slices shown]
[im 1/15]
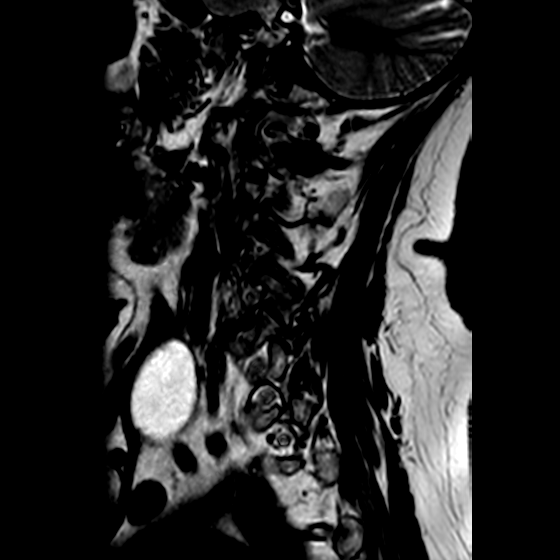

[Series 604: csp oblq left · oblique · 1.0mm · 0.15mm/px · 3 of 36 slices shown]
[im 1/36]
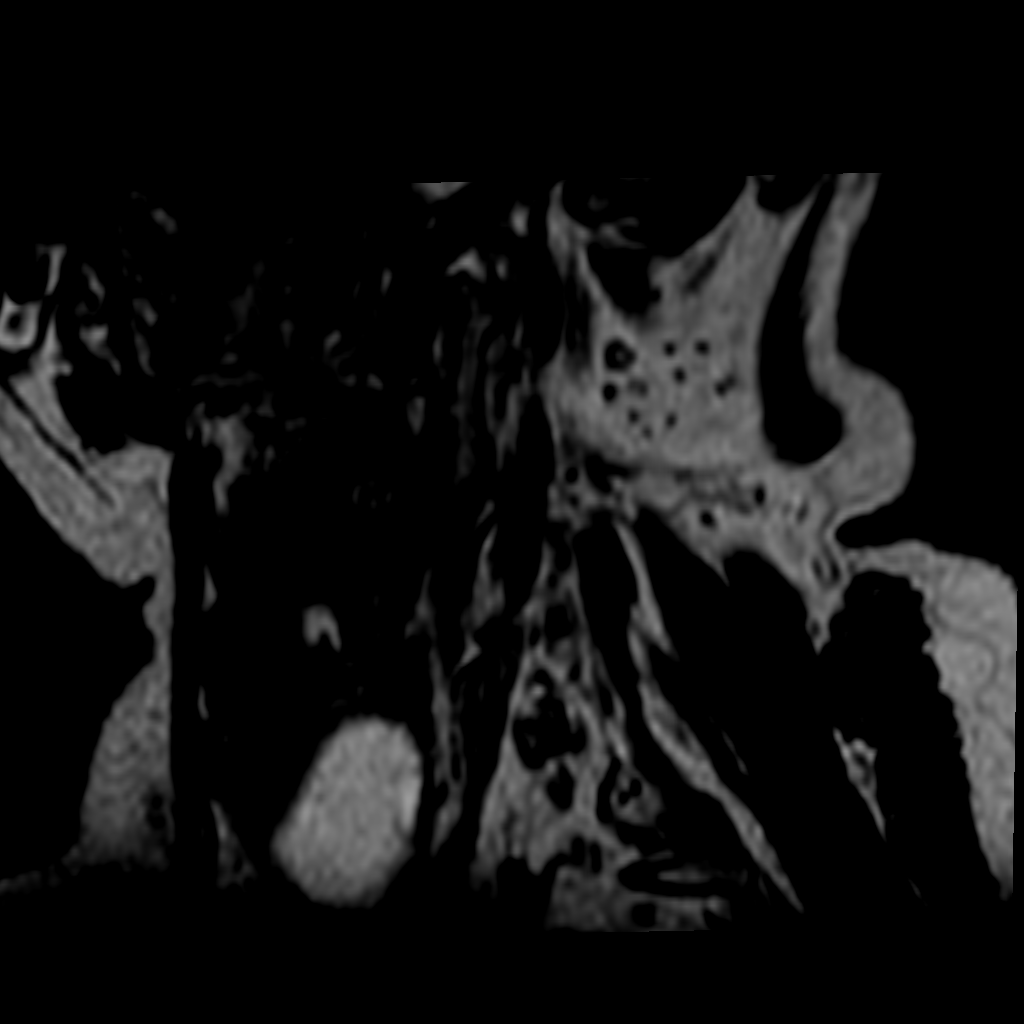
[im 18/36]
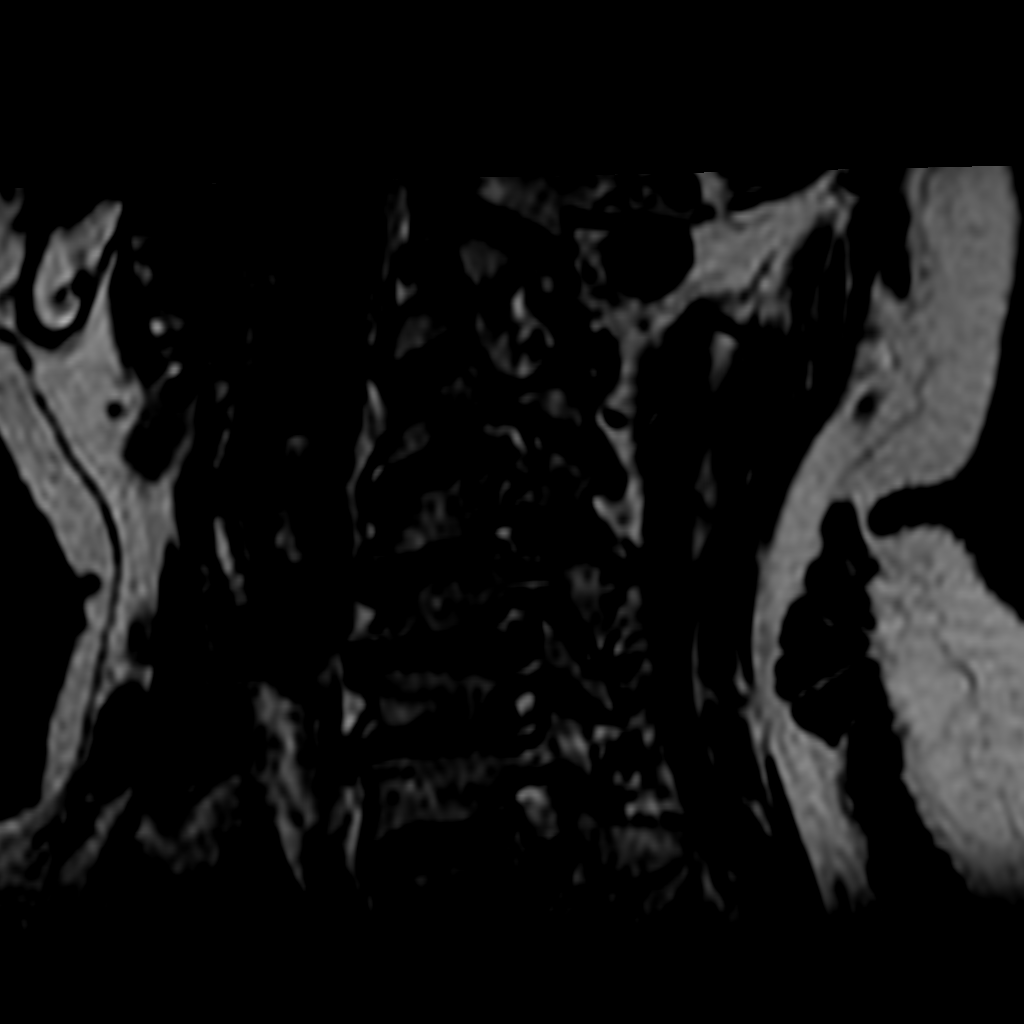
[im 36/36]
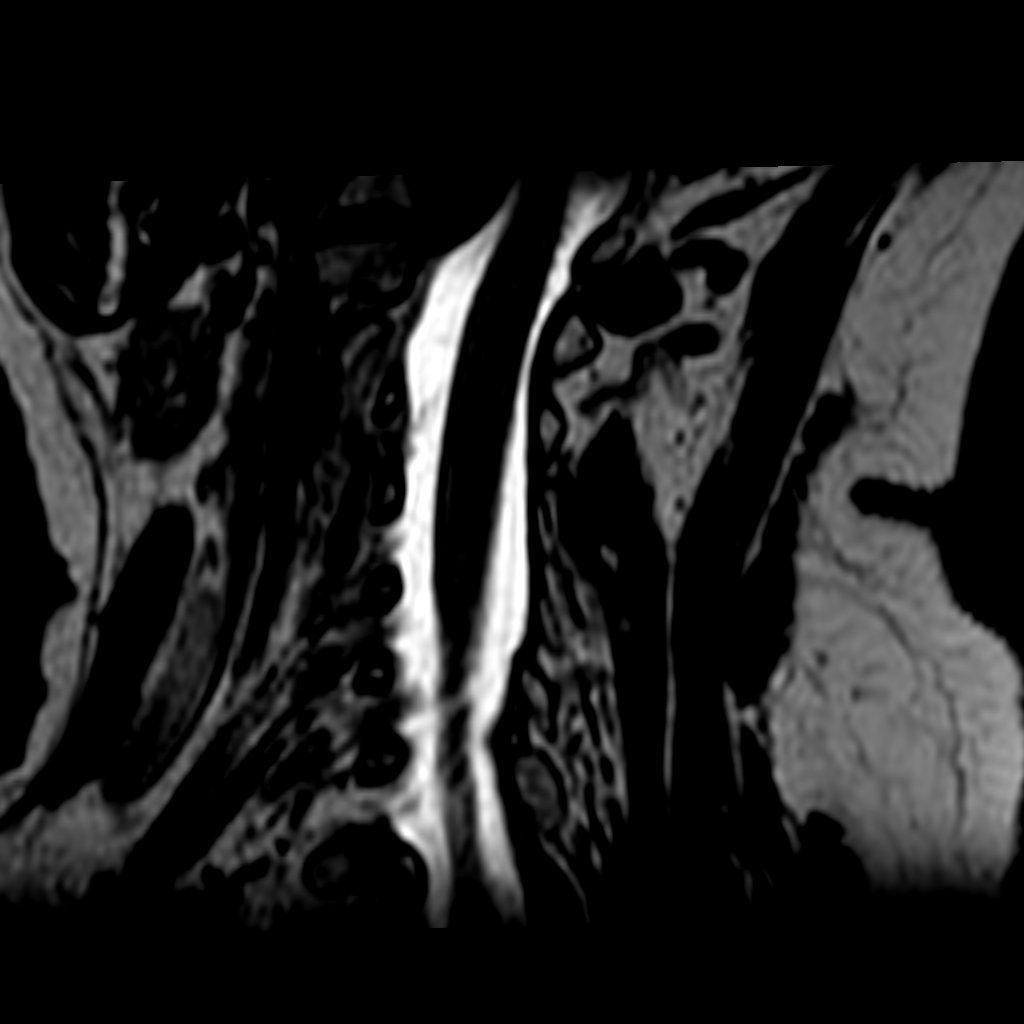

[Series 701: T2 · axial · 3.0mm · 0.29mm/px · z∈[-22,+45]mm · 2 of 18 slices shown]
[im 1/18]
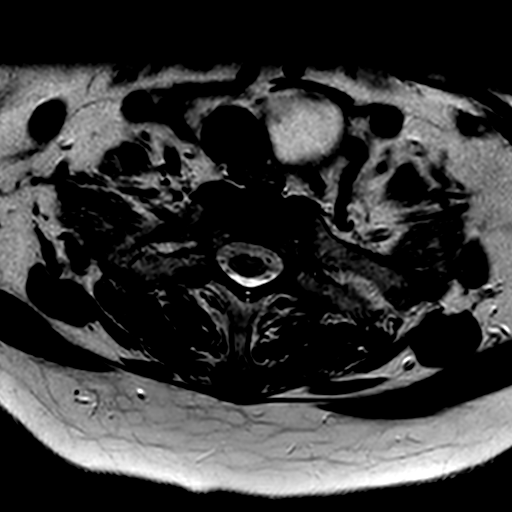
[im 18/18]
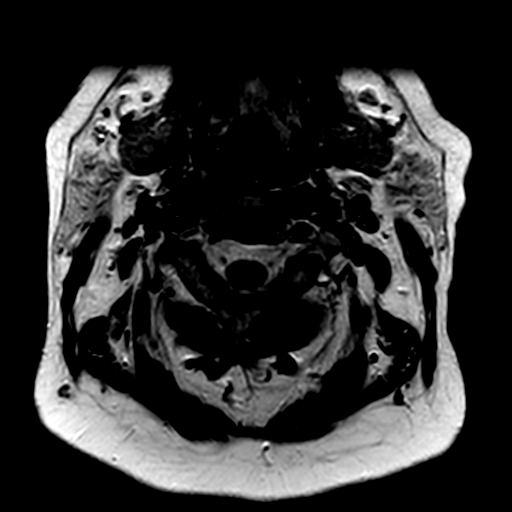

[11 of 48 positions shown; findings below may reference images not displayed]

FINDINGS: -------------------------------------------------------------------------------- 
----------------- 
GENERAL: 
ALIGNMENT: Mild anterolisthesis of C2 on C3, C3 on C4. Mild retrolisthesis of C6 
on C7. Minimal anterolisthesis of C7 on T1. 
VERTEBRAL BODY HEIGHT: Normal.  
MARROW SIGNAL: Active endplate change at C6-C7. 
CORD SIGNAL: Normal.  
ADDITIONAL FINDINGS: Large left thyroid nodule, likely colloid cyst, measuring 
3.1 cm. 
-------------------------------------------------------------------------------- 
---------------- 
SEGMENTAL: 
CRANIOCERVICAL JUNCTION: No significant stenosis. 
C2-C3: Trace disc bulge. No significant central canal or neural foraminal 
narrowing.  
C3-C4: Disc osteophyte complex. Right uncovertebral joint hypertrophy. Mild 
bilateral facet hypertrophy. Mild deformity of ventral cord with mild central 
canal narrowing. No significant neural foraminal narrowing. 
C4-C5: Disc osteophyte complex with bilateral uncovertebral joint hypertrophy. 
Left facet hypertrophy. Mild deformity of ventral cord with mild central canal 
narrowing. No significant right neural foraminal narrowing. Mild left neural 
foraminal narrowing. 
C5-C6: Disc osteophyte complex with bilateral uncovertebral joint hypertrophy. 
Mild central canal narrowing. Moderate right and severe left neural foraminal 
narrowing. 
C6-C7: Disc osteophyte complex with bilateral uncovertebral joint hypertrophy. 
Mild central canal narrowing. Moderate right neural foraminal narrowing. Mild 
left neural foraminal narrowing. 
C7-T1: Mild disc osteophyte complex. Mild left uncovertebral joint hypertrophy. 
Mild right facet hypertrophy. No significant central canal narrowing. No 
significant neural foraminal narrowing. 
-------------------------------------------------------------------------------- 
---------------
IMPRESSION: 1.  Discogenic/degenerative changes as above. 
2.  Cord signal abnormality: None. 
3.  Cord deformity: C3-C4, C4-C5 
4.  Severe neural foraminal narrowing: C5-C6 (left). 
5.  Likely large left thyroid colloid cyst, advise ultrasound correlation.

## 2024-01-04 IMAGING — MR MRI LUMBAR SPINE WITHOUT CONTRAST
7 of 9 series · 15 of 48 positions shown · IV contrast (gadolinium)
Comparison: MRI lumbar spine from June 01, 2022.

________________________________________________________________________________________________ 
MRI LUMBAR SPINE WITHOUT CONTRAST, 01/04/2024 [DATE]: 
CLINICAL INDICATION: Low back pain.
TECHNIQUE: Multiplanar, multiecho position MR images of the lumbar spine were 
performed without intravenous gadolinium enhancement. Patient was scanned on a 
1.5T magnet

[Series 1001: survey · axial · 10.0mm · 1.25mm/px · z∈[-341,-113]mm · 2 of 10 slices shown]
[im 1/10]
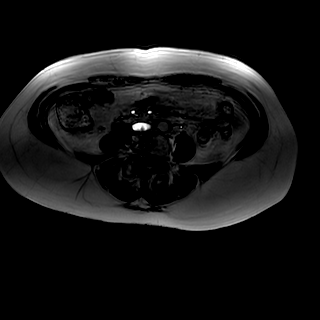
[im 10/10]
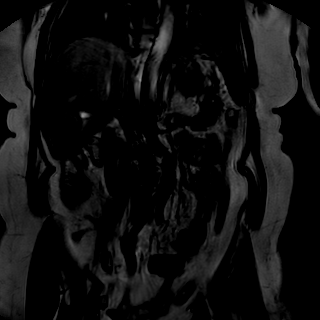

[Series 1101: t2w_cor-surv · coronal · 6.0mm · 0.59mm/px · 1 of 10 slices shown]
[im 1/10]
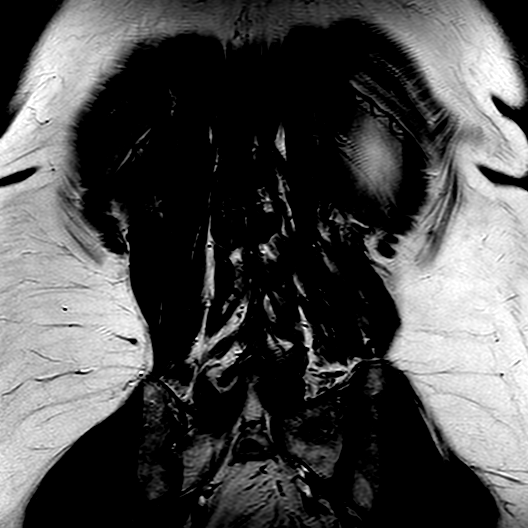

[Series 1201: t1_tse_sag · sagittal · 4.0mm · 0.34mm/px · 2 of 19 slices shown]
[im 1/19]
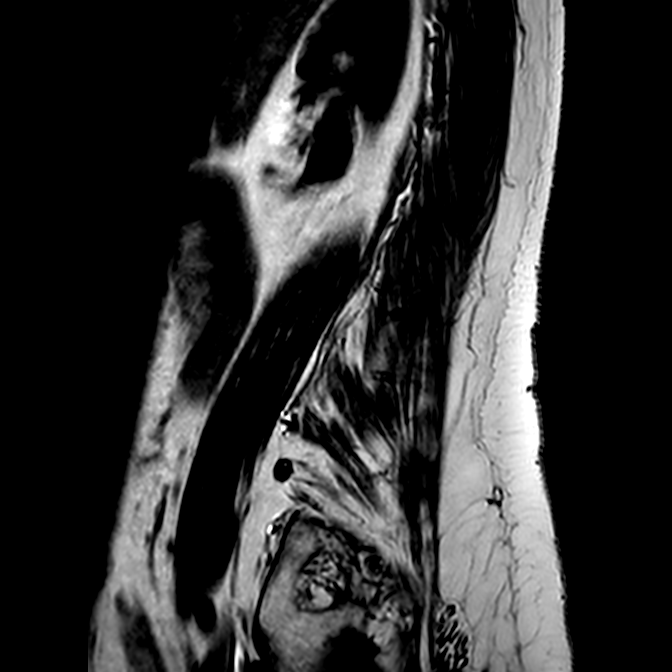
[im 19/19]
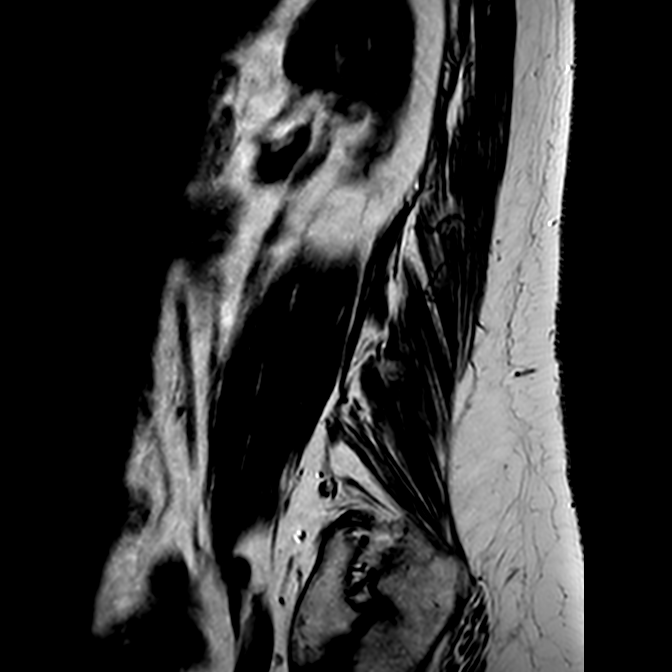

[Series 1302: (id)_mdixon_tse · sagittal · 4.0mm · 0.53mm/px · 2 of 19 slices shown]
[im 1/19]
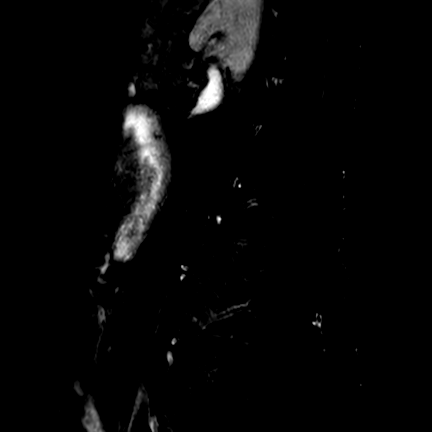
[im 19/19]
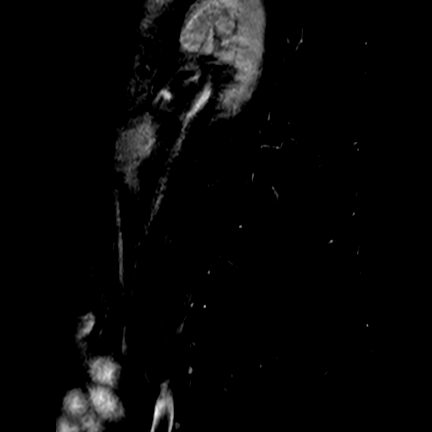

[Series 1303: st2w_mdixon_tse · sagittal · 4.0mm · 0.53mm/px · 2 of 19 slices shown]
[im 1/19]
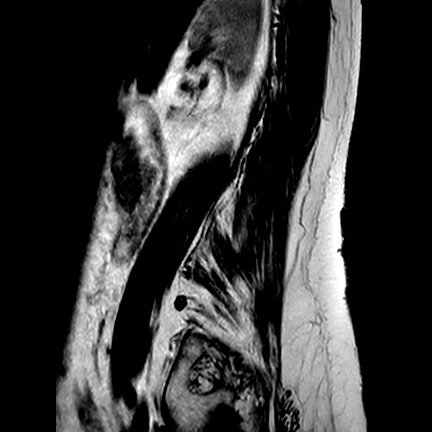
[im 19/19]
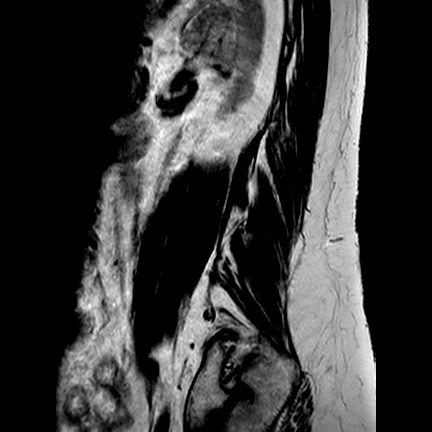

[Series 1402: (id) view_ax mpr · axial · 1.0mm · 0.25mm/px · 1 of 136 slices shown]
[im 10/136]
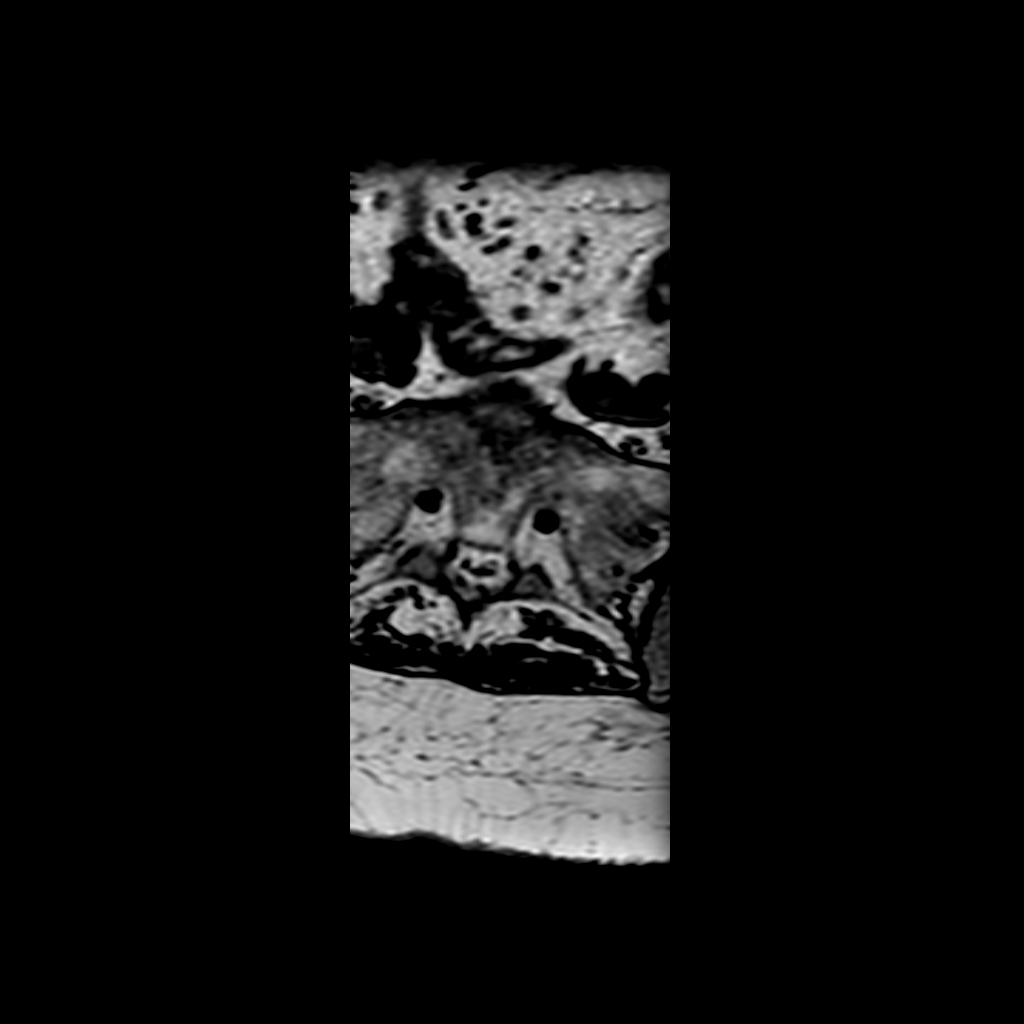

[Series 1601: T1 · axial · 4.0mm · 0.39mm/px · z∈[-412,-236]mm · 5 of 40 slices shown]
[im 1/40]
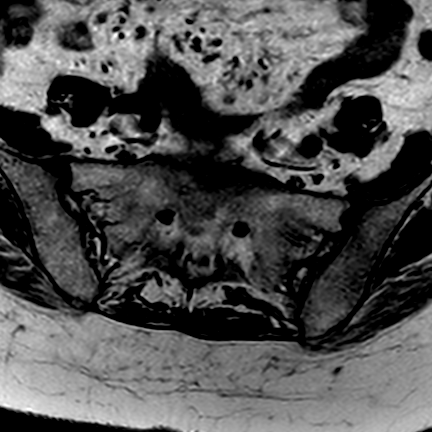
[im 10/40]
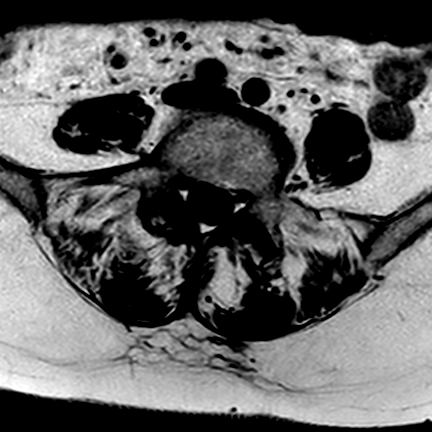
[im 20/40]
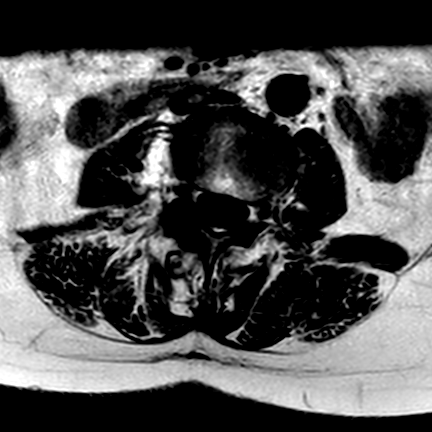
[im 30/40]
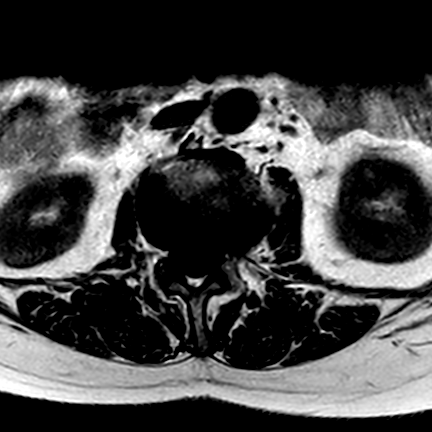
[im 40/40]
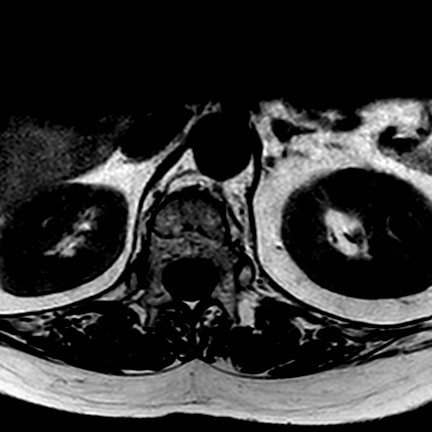

[15 of 48 positions shown; findings below may reference images not displayed]

FINDINGS: -------------------------------------------------------------------------------- 
------ 
GENERAL: 
Nomenclature is based on 5 lumbar type vertebral bodies.     
ALIGNMENT: Leftward curvature of the lower lumbar spine centered at L3-L4, mild 
rightward curvature at the thoracolumbar junction. Right lateral subluxation of 
L2 on L3. Mild retrolisthesis of T12 on L1, L1 on L2, L2 on L3, L3 on L4. 
VERTEBRAL BODY HEIGHT: Normal.  
MARROW SIGNAL: Active endplate change at T12-L1, L1-L2, L2-L3. Edema in the 
right L5-S1 facet joint from active degenerative change. 
CORD SIGNAL: Normal distal spinal cord and cauda equina.  Conus terminates at L1 
inferior endplate level. 
ADDITIONAL FINDINGS: None. 
Modic I-II: Modic change at T12-L1, L1-L2, L2-L3. 
Ligamentum Flavum > 2.5 mm: All levels. 
-------------------------------------------------------------------------------- 
------ 
SEGMENTAL: 
T11-T12: Disc bulge, bilateral facet hypertrophy; mild central canal narrowing. 
No significant neural foraminal narrowing. 
T12-L1: Disc bulge eccentric to the left side, loss of disc height; mild 
left-sided central canal narrowing.  No significant right neural foraminal 
narrowing. No significant left neural foraminal narrowing.  
L1-L2: Disc bulge, loss of disc height; no significant central canal narrowing 
and mild left subarticular recess narrowing.  No significant right neural 
foraminal narrowing. Mild left neural foraminal narrowing.  
L2-L3: Disc bulge eccentric to the right side with loss of disc height, right 
foraminal disc herniation, right facet hypertrophy; no significant central canal 
narrowing, mild right subarticular recess narrowing.  Severe right neural 
foraminal narrowing. No significant left neural foraminal narrowing.  
L3-L4: Disc bulge, bilateral facet hypertrophy; no significant central canal 
narrowing and mild bilateral subarticular recess narrowing.  No significant 
right neural foraminal narrowing. No significant left neural foraminal 
narrowing.  
L4-L5: Disc bulge with bilateral facet hypertrophy; no significant central canal 
narrowing, mild bilateral subarticular recess narrowing.  No significant right 
neural foraminal narrowing. No significant left neural foraminal narrowing.  
L5-S1: Right facet hypertrophy greater than left, trace disc bulge noting left 
subarticular annular fissure; no significant central canal narrowing.  No 
significant right neural foraminal narrowing. No significant left neural 
foraminal narrowing.  
-------------------------------------------------------------------------------- 
------ 
CHANGES FROM PRIOR: 
Progression in right neural foraminal narrowing at L2-L3. 
-------------------------------------------------------------------------------- 
------
IMPRESSION: 1.  Discogenic/degenerative changes as above. 
2.  Progression in right neural foraminal narrowing at L2-L3, please correlate 
for right L2 radiculopathy.
